# Patient Record
Sex: Female | Born: 1975 | Race: Black or African American | Hispanic: No | Marital: Married | Smoking: Never smoker
Health system: Southern US, Community
[De-identification: ages and names within clinical notes are randomized; demographics above are authoritative.]

---

## 2004-04-25 ENCOUNTER — Inpatient Hospital Stay (HOSPITAL_COMMUNITY): Admission: AD | Admit: 2004-04-25 | Discharge: 2004-04-25 | Payer: Self-pay | Admitting: Obstetrics and Gynecology

## 2004-12-01 ENCOUNTER — Inpatient Hospital Stay (HOSPITAL_COMMUNITY): Admission: AD | Admit: 2004-12-01 | Discharge: 2004-12-03 | Payer: Self-pay | Admitting: Obstetrics and Gynecology

## 2009-10-27 ENCOUNTER — Encounter: Payer: Self-pay | Admitting: Pulmonary Disease

## 2009-10-27 DIAGNOSIS — G4730 Sleep apnea, unspecified: Secondary | ICD-10-CM | POA: Insufficient documentation

## 2009-10-28 ENCOUNTER — Telehealth: Payer: Self-pay | Admitting: Pulmonary Disease

## 2010-04-21 NOTE — Miscellaneous (Signed)
Summary: Orders Update  Clinical Lists Changes  Problems: Added new problem of SLEEP APNEA (ICD-780.57) Orders: Added new Service order of No Show NS50 (NS50) - Signed 

## 2010-04-21 NOTE — Progress Notes (Signed)
Summary: nos appt  Phone Note Call from Patient   Caller: [email or web address]  Call For: clance Summary of Call: LMTCB x2 to rsc nos from 8/8. Initial call taken by: Darletta Moll,  October 28, 2009 3:29 PM

## 2010-08-07 NOTE — H&P (Signed)
Mariah Dixon, Mariah Dixon             ACCOUNT NO.:  [id number]   MEDICAL RECORD NO.:  [id number]          PATIENT TYPE:  OBV   LOCATION:  9166                          FACILITY:  [facility]   PHYSICIAN:  Lenoard Aden, M.D.DATE OF BIRTH:  1975/09/10   DATE OF ADMISSION:  12/01/2004  DATE OF DISCHARGE:                                HISTORY & PHYSICAL   CHIEF COMPLAINT:  Spontaneous rupture of membranes at 0200.   HISTORY OF PRESENT ILLNESS:  She is a 35 year old African-American female  G1, P0, EDD December 04, 2004 at 39-4/7 weeks with spontaneous rupture of  membranes.  She has a noncontributory personal history except for a history  of migraines.  She has a family history of hypertension, heart disease,  diabetes, liver and bone cancer.  She is a nonsmoker, nondrinker.  Denies  _____________ noncontributory.  No known drug allergies.  Medications, as  noted, as prenatal vitamins.   PRENATAL LABORATORIES:  Blood type AB+.  History of sickle cell trait.  Rubella immune.  Hepatitis nonreactive.  HIV nonreactive.  GBS is positive.  She does have a history of a positive Chlamydia in February of 2006 with a  negative test of cure.  Reportedly, the father of the baby has been tested  with a negative hemoglobin electrophoresis.  Prenatal course otherwise  uncomplicated.   PHYSICAL EXAMINATION:  GENERAL:  She is an uncomfortable-appearing African-  American female in no acute distress.  HEENT:  Normal.  LUNGS:  Clear.  HEART:  Regular rate and rhythm.  ABDOMEN:  Soft, gravid, and nontender.  EXTREMITIES:  No cords.  NEUROLOGIC:  Nonfocal.   IMPRESSION:  1.  Term intrauterine pregnancy with spontaneous rupture of membranes.  2.  History of Chlamydia.  3.  Group B Streptococcus positive.   PLAN:  Pitocin augmentation.  Anticipate attempts at vaginal delivery.  Epidural as needed.      Lenoard Aden, M.D.  Electronically Signed     RJT/MEDQ  D:  12/01/2004  T:  12/01/2004   Job:  [id number]

## 2015-01-05 ENCOUNTER — Ambulatory Visit (INDEPENDENT_AMBULATORY_CARE_PROVIDER_SITE_OTHER): Payer: BLUE CROSS/BLUE SHIELD | Admitting: Urgent Care

## 2015-01-05 ENCOUNTER — Ambulatory Visit (INDEPENDENT_AMBULATORY_CARE_PROVIDER_SITE_OTHER): Payer: BLUE CROSS/BLUE SHIELD

## 2015-01-05 VITALS — BP 122/74 | HR 78 | Temp 98.3°F | Resp 16 | Ht 69.5 in | Wt 182.0 lb

## 2015-01-05 DIAGNOSIS — M79675 Pain in left toe(s): Secondary | ICD-10-CM | POA: Diagnosis not present

## 2015-01-05 DIAGNOSIS — S9032XA Contusion of left foot, initial encounter: Secondary | ICD-10-CM | POA: Diagnosis not present

## 2015-01-05 DIAGNOSIS — S91312A Laceration without foreign body, left foot, initial encounter: Secondary | ICD-10-CM | POA: Diagnosis not present

## 2015-01-05 DIAGNOSIS — S99922A Unspecified injury of left foot, initial encounter: Secondary | ICD-10-CM

## 2015-01-05 MED ORDER — NAPROXEN SODIUM 550 MG PO TABS: 550.0000 mg | ORAL_TABLET | Freq: Two times a day (BID) | ORAL | Status: AC

## 2015-01-05 NOTE — Patient Instructions (Signed)

## 2015-01-05 NOTE — Progress Notes (Signed)
    MRN: [id number] DOB: 1975-07-04  Subjective:   Mariah NorrisSherrie L Dixon is a 39 y.o. female presenting for chief complaint of Foot Pain  Reports 2 week history of left foot injury, dropped a large glass baking dish over her left toe/foot. Has had persistent toe and foot pain, difficulty walking, bruising, difficulty pointing her toes up. Has tried ibuprofen, icing with some relief. Patient works as an Environmental health practitioneradministrative assistant, is not on her feet all day. Denies fever, redness, bony deformity, feeling popping or clicking sensations, numbness or tingling. Denies any other aggravating or relieving factors, no other questions or concerns.  Amir currently has no medications in their medication list. Also has no allergies on file.  Aprel  has no past medical history on file. Also  has no past surgical history on file.  Objective:   Vitals: BP 122/74 mmHg  Pulse 78  Temp(Src) 98.3 F (36.8 C) (Oral)  Resp 16  Ht 5' 9.5" (1.765 m)  Wt 182 lb (82.555 kg)  BMI 26.50 kg/m2  SpO2 99%  LMP 12/06/2014  Physical Exam  Constitutional: She is oriented to person, place, and time. She appears well-developed and well-nourished.  Cardiovascular: Normal rate.   Pulmonary/Chest: Effort normal.  Musculoskeletal:       Left foot: There is decreased range of motion (Cannot dorsiflex left great toe), tenderness, bony tenderness (over 1st MTP, proximal phanlyx of left great toe), swelling (trace) and laceration (~1cm in length, with surrounding granulated tissue). There is normal capillary refill, no crepitus and no deformity.       Feet:  Neurological: She is alert and oriented to person, place, and time.  Skin: Skin is warm and dry. No rash noted. No erythema. No pallor.  Psychiatric: She has a normal mood and affect.   [facility] reading (PRIMARY) by  Dr. Cleta Albertsaub and PA-Baker [place]. Left great toe - no acute process. Left foot - no acute process.  Assessment and Plan :   This case was precepted with Dr.  Cleta Albertsaub.  1. Foot injury, left, initial encounter 2. Great toe pain, left 3. Foot contusion, left, initial encounter - X-ray over-read pending, will manage patient conservatively, RICE method, NSAID for pain and inflammation. Patient will call me if she has no improvement in 2 weeks, consider referral to ortho.  Wallis BambergMario Andrue Dini, PA-C [facility] [phone] 01/05/2015 2:11 PM

## 2017-03-22 IMAGING — CR DG TOE GREAT 2+V*L*
1 series · 1 of 1 positions shown · non-contrast
Comparison: None.

CLINICAL DATA: 39-year-old female with left great toe pain.

EXAM:
LEFT GREAT TOE

[AP]
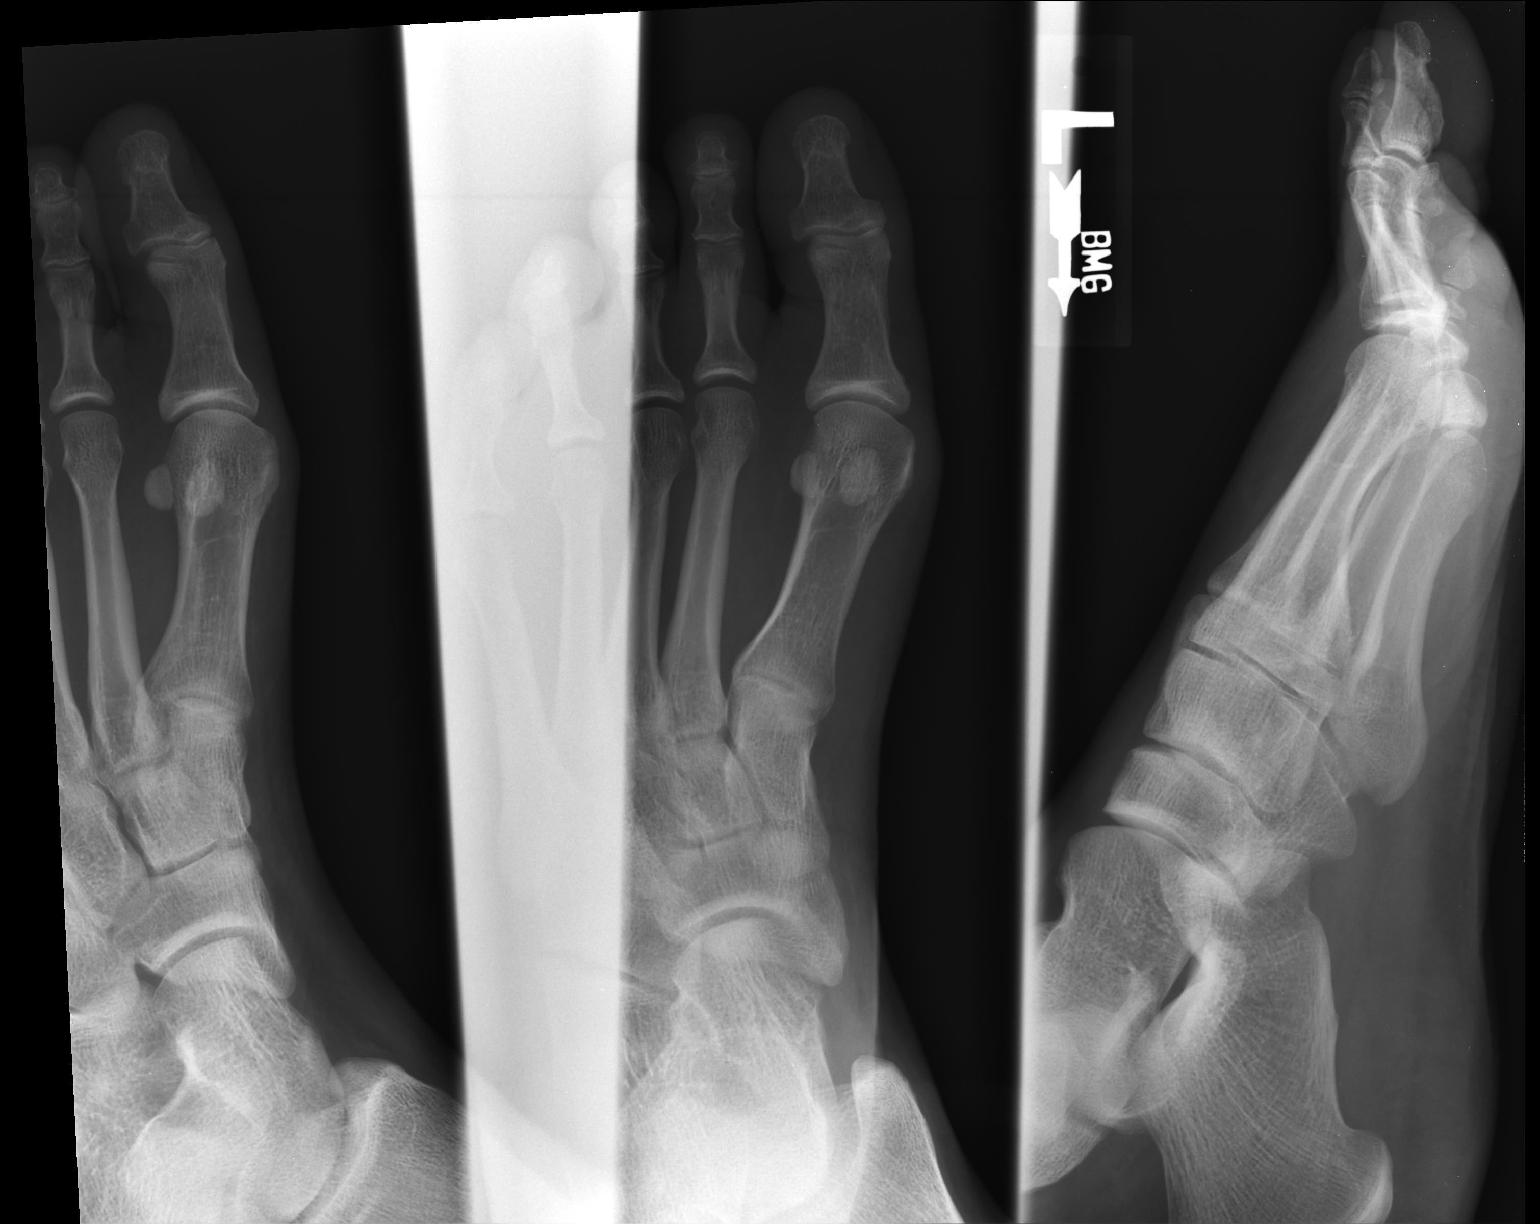

[1 of 1 positions shown; findings below may reference images not displayed]

FINDINGS: There is no evidence of fracture or dislocation. There is no
evidence of arthropathy or other focal bone abnormality. Soft
tissues are unremarkable.
IMPRESSION: Negative.

## 2017-08-16 DIAGNOSIS — Z1231 Encounter for screening mammogram for malignant neoplasm of breast: Secondary | ICD-10-CM | POA: Diagnosis not present

## 2017-08-16 DIAGNOSIS — Z6826 Body mass index (BMI) 26.0-26.9, adult: Secondary | ICD-10-CM | POA: Diagnosis not present

## 2017-08-16 DIAGNOSIS — Z01419 Encounter for gynecological examination (general) (routine) without abnormal findings: Secondary | ICD-10-CM | POA: Diagnosis not present

## 2017-08-16 DIAGNOSIS — Z1151 Encounter for screening for human papillomavirus (HPV): Secondary | ICD-10-CM | POA: Diagnosis not present

## 2017-08-19 DIAGNOSIS — Z3042 Encounter for surveillance of injectable contraceptive: Secondary | ICD-10-CM | POA: Diagnosis not present

## 2017-11-04 DIAGNOSIS — Z3042 Encounter for surveillance of injectable contraceptive: Secondary | ICD-10-CM | POA: Diagnosis not present

## 2018-01-20 DIAGNOSIS — Z3042 Encounter for surveillance of injectable contraceptive: Secondary | ICD-10-CM | POA: Diagnosis not present

## 2018-04-07 DIAGNOSIS — Z3042 Encounter for surveillance of injectable contraceptive: Secondary | ICD-10-CM | POA: Diagnosis not present

## 2018-06-29 DIAGNOSIS — Z3042 Encounter for surveillance of injectable contraceptive: Secondary | ICD-10-CM | POA: Diagnosis not present

## 2018-08-25 DIAGNOSIS — Z13220 Encounter for screening for lipoid disorders: Secondary | ICD-10-CM | POA: Diagnosis not present

## 2018-08-25 DIAGNOSIS — Z01419 Encounter for gynecological examination (general) (routine) without abnormal findings: Secondary | ICD-10-CM | POA: Diagnosis not present

## 2018-08-25 DIAGNOSIS — Z309 Encounter for contraceptive management, unspecified: Secondary | ICD-10-CM | POA: Diagnosis not present

## 2018-08-25 DIAGNOSIS — Z130 Encounter for screening for diseases of the blood and blood-forming organs and certain disorders involving the immune mechanism: Secondary | ICD-10-CM | POA: Diagnosis not present

## 2018-08-25 DIAGNOSIS — Z6828 Body mass index (BMI) 28.0-28.9, adult: Secondary | ICD-10-CM | POA: Diagnosis not present

## 2018-08-25 DIAGNOSIS — Z1231 Encounter for screening mammogram for malignant neoplasm of breast: Secondary | ICD-10-CM | POA: Diagnosis not present

## 2018-08-25 DIAGNOSIS — Z0000 Encounter for general adult medical examination without abnormal findings: Secondary | ICD-10-CM | POA: Diagnosis not present

## 2018-08-25 DIAGNOSIS — Z1329 Encounter for screening for other suspected endocrine disorder: Secondary | ICD-10-CM | POA: Diagnosis not present

## 2018-08-25 DIAGNOSIS — Z131 Encounter for screening for diabetes mellitus: Secondary | ICD-10-CM | POA: Diagnosis not present

## 2018-09-18 DIAGNOSIS — Z3042 Encounter for surveillance of injectable contraceptive: Secondary | ICD-10-CM | POA: Diagnosis not present

## 2018-12-11 DIAGNOSIS — Z3042 Encounter for surveillance of injectable contraceptive: Secondary | ICD-10-CM | POA: Diagnosis not present

## 2019-03-05 DIAGNOSIS — Z3042 Encounter for surveillance of injectable contraceptive: Secondary | ICD-10-CM | POA: Diagnosis not present

## 2024-04-25 ENCOUNTER — Other Ambulatory Visit: Payer: Self-pay | Admitting: Obstetrics & Gynecology

## 2024-04-25 ENCOUNTER — Encounter: Payer: Self-pay | Admitting: Obstetrics & Gynecology

## 2024-04-25 DIAGNOSIS — N6489 Other specified disorders of breast: Secondary | ICD-10-CM

## 2024-04-27 ENCOUNTER — Other Ambulatory Visit: Payer: Self-pay | Admitting: Obstetrics & Gynecology

## 2024-04-27 DIAGNOSIS — R928 Other abnormal and inconclusive findings on diagnostic imaging of breast: Secondary | ICD-10-CM

## 2024-04-27 DIAGNOSIS — N6489 Other specified disorders of breast: Secondary | ICD-10-CM

## 2024-05-02 ENCOUNTER — Other Ambulatory Visit: Payer: Self-pay
